# Patient Record
Sex: Female | Born: 1970 | Hispanic: No | Marital: Married | Smoking: Never smoker
Health system: Southern US, Community
[De-identification: ages and names within clinical notes are randomized; demographics above are authoritative.]

## PROBLEM LIST (undated history)

## (undated) DIAGNOSIS — E079 Disorder of thyroid, unspecified: Secondary | ICD-10-CM

## (undated) DIAGNOSIS — E119 Type 2 diabetes mellitus without complications: Secondary | ICD-10-CM

## (undated) HISTORY — DX: Type 2 diabetes mellitus without complications: E11.9

---

## 2005-04-12 ENCOUNTER — Other Ambulatory Visit: Admission: RE | Admit: 2005-04-12 | Discharge: 2005-04-12 | Payer: Self-pay | Admitting: Family Medicine

## 2006-10-08 ENCOUNTER — Other Ambulatory Visit: Admission: RE | Admit: 2006-10-08 | Discharge: 2006-10-08 | Payer: Self-pay | Admitting: Family Medicine

## 2007-10-23 ENCOUNTER — Other Ambulatory Visit: Admission: RE | Admit: 2007-10-23 | Discharge: 2007-10-23 | Payer: Self-pay | Admitting: Family Medicine

## 2016-01-06 ENCOUNTER — Encounter (HOSPITAL_COMMUNITY): Payer: Self-pay | Admitting: Emergency Medicine

## 2016-01-06 ENCOUNTER — Emergency Department (HOSPITAL_COMMUNITY): Payer: Self-pay

## 2016-01-06 ENCOUNTER — Emergency Department (HOSPITAL_COMMUNITY)
Admission: EM | Admit: 2016-01-06 | Discharge: 2016-01-06 | Disposition: A | Discharge status: Home / Self Care | Payer: Self-pay | Attending: Emergency Medicine | Admitting: Emergency Medicine

## 2016-01-06 DIAGNOSIS — N730 Acute parametritis and pelvic cellulitis: Secondary | ICD-10-CM

## 2016-01-06 DIAGNOSIS — R102 Pelvic and perineal pain: Secondary | ICD-10-CM | POA: Insufficient documentation

## 2016-01-06 DIAGNOSIS — N739 Female pelvic inflammatory disease, unspecified: Secondary | ICD-10-CM | POA: Insufficient documentation

## 2016-01-06 HISTORY — DX: Disorder of thyroid, unspecified: E07.9

## 2016-01-06 LAB — CBC WITH DIFFERENTIAL/PLATELET
Basophils Absolute: 0 10*3/uL (ref 0.0–0.1)
Basophils Relative: 0 %
Eosinophils Absolute: 0.2 10*3/uL (ref 0.0–0.7)
Eosinophils Relative: 2 %
HEMATOCRIT: 41.7 % (ref 36.0–46.0)
Hemoglobin: 14.1 g/dL (ref 12.0–15.0)
Lymphocytes Relative: 19 %
Lymphs Abs: 2.2 10*3/uL (ref 0.7–4.0)
MCH: 30.9 pg (ref 26.0–34.0)
MCHC: 33.8 g/dL (ref 30.0–36.0)
MCV: 91.2 fL (ref 78.0–100.0)
MONO ABS: 0.5 10*3/uL (ref 0.1–1.0)
MONOS PCT: 5 %
NEUTROS ABS: 8.8 10*3/uL — AB (ref 1.7–7.7)
Neutrophils Relative %: 74 %
Platelets: 374 10*3/uL (ref 150–400)
RBC: 4.57 MIL/uL (ref 3.87–5.11)
RDW: 11.6 % (ref 11.5–15.5)
WBC: 11.8 10*3/uL — ABNORMAL HIGH (ref 4.0–10.5)

## 2016-01-06 LAB — URINE MICROSCOPIC-ADD ON: RBC / HPF: NONE SEEN RBC/hpf (ref 0–5)

## 2016-01-06 LAB — COMPREHENSIVE METABOLIC PANEL
ALT: 16 U/L (ref 14–54)
ANION GAP: 10 (ref 5–15)
AST: 16 U/L (ref 15–41)
Albumin: 3.8 g/dL (ref 3.5–5.0)
Alkaline Phosphatase: 173 U/L — ABNORMAL HIGH (ref 38–126)
BILIRUBIN TOTAL: 0.6 mg/dL (ref 0.3–1.2)
BUN: 9 mg/dL (ref 6–20)
CO2: 26 mmol/L (ref 22–32)
Calcium: 9.2 mg/dL (ref 8.9–10.3)
Chloride: 96 mmol/L — ABNORMAL LOW (ref 101–111)
Creatinine, Ser: 0.68 mg/dL (ref 0.44–1.00)
GFR calc Af Amer: >60 mL/min (ref >60)
Glucose, Bld: 310 mg/dL — ABNORMAL HIGH (ref 65–99)
POTASSIUM: 4.1 mmol/L (ref 3.5–5.1)
Sodium: 132 mmol/L — ABNORMAL LOW (ref 135–145)
TOTAL PROTEIN: 7.8 g/dL (ref 6.5–8.1)

## 2016-01-06 LAB — LIPASE, BLOOD: Lipase: 31 U/L (ref 11–51)

## 2016-01-06 LAB — URINALYSIS, ROUTINE W REFLEX MICROSCOPIC
Bilirubin Urine: NEGATIVE
Glucose, UA: 500 mg/dL — AB
HGB URINE DIPSTICK: NEGATIVE
Ketones, ur: 15 mg/dL — AB
Nitrite: NEGATIVE
PROTEIN: NEGATIVE mg/dL
Specific Gravity, Urine: 1.015 (ref 1.005–1.030)
pH: 6 (ref 5.0–8.0)

## 2016-01-06 LAB — I-STAT BETA HCG BLOOD, ED (MC, WL, AP ONLY)

## 2016-01-06 MED ORDER — PROBENECID 500 MG PO TABS
1000.0000 mg | ORAL_TABLET | Freq: Once | ORAL | Status: AC
  Administered 2016-01-06: 1000 mg via ORAL
  Filled 2016-01-06 (×2): qty 2

## 2016-01-06 MED ORDER — DOXYCYCLINE HYCLATE 100 MG PO CAPS: ORAL_CAPSULE | ORAL | 0 refills | Status: DC

## 2016-01-06 MED ORDER — IOPAMIDOL (ISOVUE-300) INJECTION 61%
INTRAVENOUS | Status: AC
  Administered 2016-01-06: 100 mL
  Filled 2016-01-06: qty 100

## 2016-01-06 MED ORDER — DOXYCYCLINE HYCLATE 100 MG PO TABS
100.0000 mg | ORAL_TABLET | Freq: Once | ORAL | Status: AC
  Administered 2016-01-06: 100 mg via ORAL
  Filled 2016-01-06: qty 1

## 2016-01-06 MED ORDER — DEXTROSE 5 % IV SOLN
2.0000 g | Freq: Once | INTRAVENOUS | Status: AC
  Administered 2016-01-06: 2 g via INTRAVENOUS
  Filled 2016-01-06: qty 2

## 2016-01-06 MED ORDER — METFORMIN HCL 500 MG PO TABS: 500.0000 mg | ORAL_TABLET | Freq: Two times a day (BID) | ORAL | 0 refills | Status: DC

## 2016-01-06 NOTE — ED Triage Notes (Addendum)
Pt reports 3 weeks of lower abdominal pain. States only eating small amounts because she feels bloated all the time. Pt states last bowel movement this AM. Used laxative yesterday and stool softener the day before. Denies recent fever. Reports subjective fever a few weeks ago when this all started. Pt awake, alert, oriented x4, NAD at present. VSS.

## 2016-01-06 NOTE — Discharge Instructions (Signed)
Follow-up with the women's clinic next week to check on your infection. Follow-up with her family doctor next week to help with your elevated sugar

## 2016-01-06 NOTE — ED Provider Notes (Signed)
[facility]-EM[facility]Y DEPT Provider Note   CSN: [id number] Arrival date & time: 01/06/16  4098     History   Chief Complaint Chief Complaint  Patient presents with  . Abdominal Pain  . Bloated    HPI Olivia Reeves is a 45 y.o. female.  Patient complains of lower abdominal pain.   The history is provided by the patient. No language interpreter was used.  Abdominal Pain   This is a new problem. The current episode started more than 2 days ago. The problem occurs constantly. The problem has not changed since onset.Associated with: Nothing. The pain is located in the RLQ. The quality of the pain is aching. The pain is at a severity of 5/10. The pain is moderate. Pertinent negatives include anorexia, diarrhea, frequency, hematuria and headaches. Nothing aggravates the symptoms. Nothing relieves the symptoms. Past workup does not include GI consult. Her past medical history does not include PUD.    Past Medical History:  Diagnosis Date  . Thyroid disease     There are no active problems to display for this patient.   Past Surgical History:  Procedure Laterality Date  . CESAREAN SECTION      OB History    No data available       Home Medications    Prior to Admission medications   Medication Sig Start Date End Date Taking? Authorizing Provider  doxycycline (VIBRAMYCIN) 100 MG capsule Take one pill bid 01/06/16   Bethann Berkshire, MD  metFORMIN (GLUCOPHAGE) 500 MG tablet Take 1 tablet (500 mg total) by mouth 2 (two) times daily with a meal. 01/06/16   Bethann Berkshire, MD    Family History No family history on file.  Social History Social History  Substance Use Topics  . Smoking status: Never Smoker  . Smokeless tobacco: Not on file  . Alcohol use Yes     Comment: 1 glass of wine/week     Allergies   Aspirin   Review of Systems Review of Systems  Constitutional: Negative for appetite change and fatigue.  HENT: Negative for congestion, ear discharge and sinus pressure.    Eyes: Negative for discharge.  Respiratory: Negative for cough.   Cardiovascular: Negative for chest pain.  Gastrointestinal: Positive for abdominal pain. Negative for anorexia and diarrhea.  Genitourinary: Negative for frequency and hematuria.  Musculoskeletal: Negative for back pain.  Skin: Negative for rash.  Neurological: Negative for seizures and headaches.  Psychiatric/Behavioral: Negative for hallucinations.     Physical Exam Updated Vital Signs BP 116/81 (BP Location: Right Arm)   Pulse 80   Temp 98.3 F (36.8 C) (Oral)   Resp 16   Wt 135 lb (61.2 kg)   LMP  (LMP Unknown) Comment: pt with IUD  SpO2 97%   Physical Exam  Constitutional: She is oriented to person, place, and time. She appears well-developed.  HENT:  Head: Normocephalic.  Eyes: Conjunctivae and EOM are normal. No scleral icterus.  Neck: Neck supple. No thyromegaly present.  Cardiovascular: Normal rate and regular rhythm.  Exam reveals no gallop and no friction rub.   No murmur heard. Pulmonary/Chest: No stridor. She has no wheezes. She has no rales. She exhibits no tenderness.  Abdominal: She exhibits no distension. There is tenderness. There is no rebound.  Mild suprapubic and right lower quadrant abdominal pain and tenderness  Musculoskeletal: Normal range of motion. She exhibits no edema.  Lymphadenopathy:    She has no cervical adenopathy.  Neurological: She is oriented to person, place, and  time. She exhibits normal muscle tone. Coordination normal.  Skin: No rash noted. No erythema.  Psychiatric: She has a normal mood and affect. Her behavior is normal.     ED Treatments / Results  Labs (all labs ordered are listed, but only abnormal results are displayed) Labs Reviewed  CBC WITH DIFFERENTIAL/PLATELET - Abnormal; Notable for the following:       Result Value   WBC 11.8 (*)    Neutro Abs 8.8 (*)    All other components within normal limits  COMPREHENSIVE METABOLIC PANEL - Abnormal;  Notable for the following:    Sodium 132 (*)    Chloride 96 (*)    Glucose, Bld 310 (*)    Alkaline Phosphatase 173 (*)    All other components within normal limits  URINALYSIS, ROUTINE W REFLEX MICROSCOPIC (NOT AT [facility]) - Abnormal; Notable for the following:    Glucose, UA 500 (*)    Ketones, ur 15 (*)    Leukocytes, UA TRACE (*)    All other components within normal limits  URINE MICROSCOPIC-ADD ON - Abnormal; Notable for the following:    Squamous Epithelial / LPF 6-30 (*)    Bacteria, UA FEW (*)    All other components within normal limits  LIPASE, BLOOD  I-STAT BETA HCG BLOOD, ED ([facility], [facility], [facility] ONLY)  GC/CHLAMYDIA PROBE AMP ([place]) NOT AT [facility]    EKG  EKG Interpretation None       Radiology Ct Abdomen Pelvis W Contrast  Result Date: 01/06/2016 CLINICAL DATA:  Bilateral lower quadrant abdominal pain for 3 weeks. Difficulty going to the bathroom. EXAM: CT ABDOMEN AND PELVIS WITH CONTRAST TECHNIQUE: Multidetector CT imaging of the abdomen and pelvis was performed using the standard protocol following bolus administration of intravenous contrast. CONTRAST:  100mL ISOVUE-300 IOPAMIDOL (ISOVUE-300) INJECTION 61% COMPARISON:  None. FINDINGS: Lower chest:  Unremarkable. Hepatobiliary: 4 mm low-density lesion in the posterior segment of the right liver lobe consistent with a cyst. No other liver abnormality. Normal gallbladder. No bile duct dilation. Pancreas: Normal Spleen: Normal Adrenals/Urinary Tract: No adrenal masses. Normal kidneys, ureters and bladder. Stomach/Bowel: Normal.  Normal appendix visualized. Vascular/Lymphatic: Vasculature is unremarkable.  No adenopathy. Reproductive: Extending from the right uterine cornua, there is a fluid-filled tubular structure with a thick irregularly enhancing wall, with adjacent right adnexal fluid and stranding in the adnexal fat. The right ovary is not defined and is presumably part of this inflammatory appearing right adnexal process. No  left adnexal abnormality. Uterus is normal in size. There is a well-positioned IUD. Musculoskeletal: Unremarkable Other: Small amount of pelvic free fluid. IMPRESSION: 1. Inflammatory right adnexal process associated with a dilated thick-walled tubular structure consistent with either a hydrosalpinx or pyosalpinx. Findings support pelvic inflammatory disease in the proper clinical setting. 2. No other acute abnormality. Normal appendix is visualized. Normal appearance of the bowel. 3. Well-positioned IUD in the uterus. 4 mm liver cyst. No other abnormalities. Electronically Signed   By: Amie Portlandavid  Ormond M.D.   On: 01/06/2016 10:30    Procedures Procedures (including critical care time)  Medications Ordered in ED Medications  iopamidol (ISOVUE-300) 61 % injection (100 mLs  Contrast Given 01/06/16 1003)  cefOXitin (MEFOXIN) 2 g in dextrose 5 % 50 mL IVPB (0 g Intravenous Stopped 01/06/16 1205)  probenecid (BENEMID) tablet 1,000 mg (1,000 mg Oral Given 01/06/16 1244)  doxycycline (VIBRA-TABS) tablet 100 mg (100 mg Oral Given 01/06/16 1105)     Initial Impression / Assessment and Plan /  ED Course  I have reviewed the triage vital signs and the nursing notes.  Pertinent labs & imaging results that were available during my care of the patient were reviewed by me and considered in my medical decision making (see chart for details).  Clinical Course    Patient with PID. She is given some cefoxitin and doxycycline. Also patient's sugars elevated to 300 she will be put on Glucophage and will follow-up with the [facility] in the family doctor  Final Clinical Impressions(s) / ED Diagnoses   Final diagnoses:  PID (acute pelvic inflammatory disease)    New Prescriptions New Prescriptions   DOXYCYCLINE (VIBRAMYCIN) 100 MG CAPSULE    Take one pill bid   METFORMIN (GLUCOPHAGE) 500 MG TABLET    Take 1 tablet (500 mg total) by mouth 2 (two) times daily with a meal.     Bethann Berkshire, MD 01/06/16  1302

## 2016-01-07 LAB — GC/CHLAMYDIA PROBE AMP (~~LOC~~) NOT AT ARMC
CHLAMYDIA, DNA PROBE: NEGATIVE
NEISSERIA GONORRHEA: NEGATIVE

## 2016-01-11 ENCOUNTER — Ambulatory Visit (INDEPENDENT_AMBULATORY_CARE_PROVIDER_SITE_OTHER): Payer: Self-pay | Admitting: Obstetrics and Gynecology

## 2016-01-11 ENCOUNTER — Encounter: Payer: Self-pay | Admitting: Obstetrics and Gynecology

## 2016-01-11 VITALS — BP 109/66 | HR 69 | Wt 134.3 lb

## 2016-01-11 DIAGNOSIS — N7011 Chronic salpingitis: Secondary | ICD-10-CM

## 2016-01-11 DIAGNOSIS — N730 Acute parametritis and pelvic cellulitis: Secondary | ICD-10-CM

## 2016-01-11 DIAGNOSIS — Z30432 Encounter for removal of intrauterine contraceptive device: Secondary | ICD-10-CM

## 2016-01-11 MED ORDER — METRONIDAZOLE 500 MG PO TABS: 500.0000 mg | ORAL_TABLET | Freq: Two times a day (BID) | ORAL | 0 refills | Status: DC

## 2016-01-11 MED ORDER — MICONAZOLE NITRATE 2 % VA CREA: 1.0000 | TOPICAL_CREAM | Freq: Every day | VAGINAL | 0 refills | Status: DC

## 2016-01-11 NOTE — Procedures (Signed)
IUD Removal Procedure Note  Pre-operative Diagnosis: Mirena IUD in place and expired. Resolving abdominal pain  Post-operative Diagnosis: same  Procedure Details   The risks (including infection, bleeding, pain) and benefits of the procedure were explained to the patient and Verbal informed consent was obtained.  The patient was placed in the dorsal lithotomy position.  A Graves' speculum inserted in the vagina, and the cervix was visualized and a ringed forceps used to grasp the strings and it was easily removed and noted to be intact. No bleeding during procedure.   Condition: Stable  Complications: None  Plan: The patient was advised to call for any fever or for prolonged or severe pain or bleeding. She was advised to use condoms for birth control.  Cornelia Copaharlie Esme Durkin, Jr MD Attending [facility](Faculty Practice)

## 2016-01-11 NOTE — Progress Notes (Signed)
error 

## 2016-01-11 NOTE — Progress Notes (Signed)
Obstetrics and Gynecology Visit New Patient Evaluation  Appointment Date: 01/11/2016  OB[facility]N Clinic[facility]  Primary Care Provider: None  [facility]  Chief Complaint:  ER follow up History of Present Illness: Olivia CarbonMerlita Coughlin is a 45 y.o. Asian G3P3 (LMP: none due to Mirena), seen for the above chief complaint. Her past medical history is significant for h/o c-section x 3.    She was seen in the ED on 9/7 after 3 weeks of increasing lower abdominal pain and bloating.  In the ER she described the pain as in the RLQ, constant, aching and 5/10 severity.  She never had these symptoms before.  A CT abdomen with contrast revealed inflammatory right adnexal process suggestive of PID and no other acute abnormality, with a right adnexal inflammatory process with hydro vs pyosalpinx with a normal appendix .  She was given a dose of cefoxitin (no probenecid) and discharged with doxycycline; her GC/CT, u/a, beta hcg, cmp, lipase.  At today's visit she states she's doing well and feels much better; denies abdominal pain, fever, chills, nausea, vomiting, diarrhea, decrease in appetite, vaginal bleeding, discharge, fevers, chills, or urinary symptoms.  She has been compliant in taking her doxycycline.  The Mirena [facility] 5 years ago [facility] [place].  She is undecided on what contraceptive method she will use after the IUD is removed.  Review of Systems:Her 12 point review of systems is negative or as noted in the History of Present Illness.   Past Medical History:  Past Medical History:  Diagnosis Date  . Thyroid disease     Past Surgical History:  Past Surgical History:  Procedure Laterality Date  . CESAREAN SECTION      Past Obstetrical History:  OB History  Gravida Para Term Preterm AB Living  3 3 3         SAB TAB Ectopic Multiple Live Births          3    # Outcome Date GA Lbr Len/2nd Weight Sex Delivery Anes PTL Lv  3  Term           2 Term           1 Term              Cesarean section x 3  Past Gynecological History: As per HPI.  Social History:  Social History   Social History  . Marital status: Unknown    Spouse name: N/A  . Number of children: N/A  . Years of education: N/A   Occupational History  . Not on file.   Social History Main Topics  . Smoking status: Never Smoker  . Smokeless tobacco: Not on file  . Alcohol use Yes     Comment: 1 glass of wine/week  . Drug use: No  . Sexual activity: Yes    Birth control/ protection: IUD   Other Topics Concern  . Not on file   Social History Narrative  . No narrative on file    Family History: No family history on file. She denies any female cancers, bleeding or blood clotting disorders.   Medications Doxycycline 100mg  bid  Allergies Aspirin   Physical Exam:  BP 109/66   Pulse 69   Wt 134 lb 4.8 oz (60.9 kg)   LMP  (LMP Unknown) Comment: pt with IUD There is no height or weight on file to calculate BMI. General appearance: Well nourished, well developed female in no  acute distress.  Neck:  Supple, normal appearance, and no thyromegaly  Cardiovascular: normal s1 and s2.  No murmurs, rubs or gallops. Respiratory:  Clear to auscultation bilateral. Normal respiratory effort Abdomen: positive bowel sounds and no masses, hernias; diffusely non tender to palpation, non distended Neuro/Psych:  Normal mood and affect.  Skin:  Warm and dry.  Lymphatic:  No inguinal lymphadenopathy.   Pelvic exam: is not limited by body habitus EGBUS: within normal limits Vagina: within normal limits and with no blood in the vault, Cervix:  no lesions or cervical motion tenderness and with two grey IUD strings seen (see procedure note for easy mirena removal) Uterus:  nonenlarged and approximately 8 week sized Adnexa:  normal adnexa and no mass, fullness, tenderness Rectovaginal: deferred  Laboratory: as above  Radiology: as  above  Assessment: pt doing well  Plan:  Will add flagyl bid x 14 d given IUD is in place. Will see patient back in 6-8wks for f/u u/s to assess likely right sided hydrosalpinx. Pt doesn't have insurance and lives in [place] Resources given and pt encouraged to use [facility] and go by [facility] to establish PCP care. Pt unsure about contraception and was advised condoms   Orders Placed This Encounter  Procedures  . US Pelvis Complete  . US Transvaginal Non-OB   Review of Systems  Constitutional: Negative for chills, fever and weight loss.  Respiratory: Negative for cough and shortness of breath.   Cardiovascular: Negative for chest pain and palpitations.  Gastrointestinal: Negative for abdominal pain, diarrhea, nausea and vomiting.  Skin: Negative for rash.  Neurological: Negative for headaches.

## 2016-01-12 ENCOUNTER — Encounter: Payer: Self-pay | Admitting: Obstetrics and Gynecology

## 2016-01-12 DIAGNOSIS — N7011 Chronic salpingitis: Secondary | ICD-10-CM | POA: Insufficient documentation

## 2016-02-22 ENCOUNTER — Ambulatory Visit (HOSPITAL_COMMUNITY): Payer: Self-pay | Attending: Obstetrics and Gynecology

## 2016-02-24 ENCOUNTER — Encounter: Payer: Self-pay | Admitting: Obstetrics and Gynecology

## 2016-02-24 ENCOUNTER — Ambulatory Visit: Payer: Self-pay | Admitting: Obstetrics and Gynecology

## 2016-02-24 NOTE — Progress Notes (Signed)
Patient did not keep GYN follow up appointment for 02/24/2016.  Olivia Reeves, Jr MD Attending [facility](Faculty Practice)

## 2016-07-24 ENCOUNTER — Encounter: Payer: Self-pay | Admitting: Family Medicine

## 2016-07-24 ENCOUNTER — Ambulatory Visit (INDEPENDENT_AMBULATORY_CARE_PROVIDER_SITE_OTHER): Payer: Self-pay | Admitting: Family Medicine

## 2016-07-24 VITALS — BP 114/70 | HR 71 | Temp 98.7°F | Ht 59.0 in | Wt 137.0 lb

## 2016-07-24 DIAGNOSIS — Z114 Encounter for screening for human immunodeficiency virus [HIV]: Secondary | ICD-10-CM

## 2016-07-24 DIAGNOSIS — E119 Type 2 diabetes mellitus without complications: Secondary | ICD-10-CM

## 2016-07-24 DIAGNOSIS — R5383 Other fatigue: Secondary | ICD-10-CM

## 2016-07-24 LAB — CBC WITH DIFFERENTIAL/PLATELET
BASOS PCT: 1 %
Basophils Absolute: 60 cells/uL (ref 0–200)
EOS PCT: 4 %
Eosinophils Absolute: 240 cells/uL (ref 15–500)
HCT: 44.6 % (ref 35.0–45.0)
Hemoglobin: 15.1 g/dL (ref 11.7–15.5)
LYMPHS PCT: 43 %
Lymphs Abs: 2580 cells/uL (ref 850–3900)
MCH: 31.5 pg (ref 27.0–33.0)
MCHC: 33.9 g/dL (ref 32.0–36.0)
MCV: 92.9 fL (ref 80.0–100.0)
MONOS PCT: 4 %
MPV: 10.1 fL (ref 7.5–12.5)
Monocytes Absolute: 240 cells/uL (ref 200–950)
NEUTROS ABS: 2880 {cells}/uL (ref 1500–7800)
Neutrophils Relative %: 48 %
PLATELETS: 273 10*3/uL (ref 140–400)
RBC: 4.8 MIL/uL (ref 3.80–5.10)
RDW: 14.2 % (ref 11.0–15.0)
WBC: 6 10*3/uL (ref 3.8–10.8)

## 2016-07-24 LAB — COMPREHENSIVE METABOLIC PANEL
ALK PHOS: 47 U/L (ref 33–115)
ALT: 23 U/L (ref 6–29)
AST: 18 U/L (ref 10–35)
Albumin: 4.4 g/dL (ref 3.6–5.1)
BILIRUBIN TOTAL: 0.8 mg/dL (ref 0.2–1.2)
BUN: 10 mg/dL (ref 7–25)
CO2: 25 mmol/L (ref 20–31)
Calcium: 9.1 mg/dL (ref 8.6–10.2)
Chloride: 103 mmol/L (ref 98–110)
Creat: 0.61 mg/dL (ref 0.50–1.10)
GLUCOSE: 182 mg/dL — AB (ref 65–99)
Potassium: 4.1 mmol/L (ref 3.5–5.3)
Sodium: 137 mmol/L (ref 135–146)
Total Protein: 7.5 g/dL (ref 6.1–8.1)

## 2016-07-24 LAB — GLUCOSE, CAPILLARY: Glucose-Capillary: 177 mg/dL — ABNORMAL HIGH (ref 65–99)

## 2016-07-24 LAB — POCT GLYCOSYLATED HEMOGLOBIN (HGB A1C): Hemoglobin A1C: 9.4

## 2016-07-24 MED ORDER — METFORMIN HCL 1000 MG PO TABS: 1000.0000 mg | ORAL_TABLET | Freq: Two times a day (BID) | ORAL | 3 refills | Status: AC

## 2016-07-24 MED ORDER — GLIPIZIDE 5 MG PO TABS: 5.0000 mg | ORAL_TABLET | Freq: Two times a day (BID) | ORAL | 3 refills | Status: DC

## 2016-07-24 MED FILL — glipiZIDE 5 MG TABS: 5 | 30 days supply | Qty: 60 | Fill #0

## 2016-07-24 MED FILL — ?METFORMIN HCL 1,000 MG TAB: 1000 | 30 days supply | Qty: 60 | Fill #0

## 2016-07-24 NOTE — Progress Notes (Signed)
   Patient ID: Olivia Reeves, female    DOB: 07/13/1970, 46 y.o.   MRN: [id number]  PCP: Joaquin CourtsKimberly Joffre Lucks, FNP  Chief Complaint  Patient presents with  . Establish Care    Subjective:  HPI Olivia CarbonMerlita Daughdrill is a 46 y.o. female presenting today to establish care,  diabetes follow-up, and an acute complaint of fatigue. This patients first encounter with me today.  Today she reports that she was diagnosed with diabetes in 2017 after she was diagnosed with gestational diabetes over 5 years ago. She doesn't monitor glucose at home is uncertain of her glycemic control. She works for [employer] and other than routine physical activity associated with work she reports no routine physical activity. Kerina, reports over the last several days she has experienced increased thirst, polyuria, and recent numbness and tingling of feet and hands. Denies chest pain, shortness of breath, or dizziness. Eye exam last year although she reports that she has never had an diabetes opthalmic exam.  Fatigue Over the last several months Nafeesah reports experiencing extreme fatigue. She reports even with 8-9 hour nights sleep she continues to feel drained in spite of adequate rest. Denies history of thyroid dysfunction or sleep apnea.   Review of Systems See HPI    Objective:   Today's Vitals   07/24/16 1048  BP: 114/70  Pulse: 71  Temp: 98.7 F (37.1 C)  TempSrc: Oral  SpO2: 99%  Weight: 137 lb (62.1 kg)  Height: 4\' 11"  (1.499 m)    Wt Readings from Last 3 Encounters:  07/24/16 137 lb (62.1 kg)  01/11/16 134 lb 4.8 oz (60.9 kg)  01/06/16 135 lb (61.2 kg)    Physical Exam  Constitutional: She is oriented to person, place, and time. She appears well-developed and well-nourished.  HENT:  Head: Normocephalic.  Nose: Nose normal.  Mouth/Throat: Oropharynx is clear and moist.  Eyes: Conjunctivae are normal. Pupils are equal, round, and reactive to light.  Neck: Normal range of motion. Neck supple. No  thyromegaly present.  Cardiovascular: Normal rate, regular rhythm, normal heart sounds and intact distal pulses.   Pulmonary/Chest: Effort normal and breath sounds normal.  Musculoskeletal: Normal range of motion.  Lymphadenopathy:    She has no cervical adenopathy.  Neurological: She is alert and oriented to person, place, and time.  Skin: Skin is warm and dry.  Psychiatric: She has a normal mood and affect. Her behavior is normal. Judgment and thought content normal.     Assessment & Plan:  1. Type 2 diabetes mellitus without complication, without long-term current use of insulin (HCC)-A1C elevated 9.4 -Resume and increase Metformin 1000 mg BID with meals. -Adding Glipizide 5 mg 2 times daily before meals -If no A1C improvement within 3 months, will add Onglaza or Januvia with Metformin to improve glycemic control. -Referring to diabetes education   2. Screening for HIV (human immunodeficiency virus) - HIV antibody  3. Fatigue, consider hypothyroidism and or fatigue related to uncontrolled diabetes -Screen TSH panel  -Discussed diabetes opthalmology exam with patient. She will obtain in May.  -Follow-up in 3 months for A1C recheck, PAP, and discuss overdue immunization. -We will notify you of your lab results.  Godfrey PickKimberly S. Tiburcio PeaHarris, MSN, [facility] [address] [place], [place] [address] [phone]

## 2016-07-24 NOTE — Patient Instructions (Addendum)
Continue to increase physical activity and reduce of foods high in simple sugars and starches.  Your A1C today is 9.4.   Below are the medication changes for your diabetes management.   Take Metformin 1,000 mg twice daily with food and Glipizide 5 mg twice daily with meals.  Glipizide can cause hypoglycemia therefore ensure that your are taking this medication with food.   Schedule a follow-up to have your PAP Smear completed and I will see you back in 3 months for a routine diabetes follow-up.     Type 2 Diabetes Mellitus, Diagnosis, Adult Type 2 diabetes (type 2 diabetes mellitus) is a long-term (chronic) disease. It may be caused by one or both of these problems:  Your body does not make enough of a hormone called insulin.  Your body does not react in a normal way to insulin that it makes. Insulin lets sugars (glucose) go into cells in the body. This gives you energy. If you have type 2 diabetes, sugars cannot get into cells. This causes high blood sugar (hyperglycemia). Your doctor will set treatment goals for you. Generally, you should have these blood sugar levels:  Before meals (preprandial): 80-130 mg/dL (1.6-1.04.4-7.2 mmol/L).  After meals (postprandial): below 180 mg/dL (10 mmol/L).  A1c (hemoglobin A1c) level: less than 7%. Follow these instructions at home: Questions to Ask Your Doctor   You may want to ask these questions:  Do I need to meet with a diabetes educator?  Where can I find a support group for people with diabetes?  What equipment will I need to care for myself at home?  What diabetes medicines do I need? When should I take them?  How often do I need to check my blood sugar?  What number can I call if I have questions?  When is my next doctor's visit? General instructions   Take over-the-counter and prescription medicines only as told by your doctor.  Keep all follow-up visits as told by your doctor. This is important. Contact a doctor  if:  Your blood sugar is at or above 240 mg/dL (96.013.3 mmol/L) for 2 days in a row.  You have been sick or have had a fever for 2 days or more and you are not getting better.  You have any of these problems for more than 6 hours:  You cannot eat or drink.  You feel sick to your stomach (nauseous).  You throw up (vomit).  You have watery poop (diarrhea). Get help right away if:  Your blood sugar is lower than 54 mg/dL (3 mmol/L).  You get confused.  You have trouble:  Thinking clearly.  Breathing.  You have moderate or large ketone levels in your pee (urine). This information is not intended to replace advice given to you by your health care provider. Make sure you discuss any questions you have with your health care provider. Document Released: 01/25/2008 Document Revised: 09/23/2015 Document Reviewed: 05/21/2015 Elsevier Interactive Patient Education  2017 [employer]

## 2016-07-25 LAB — THYROID PANEL WITH TSH
FREE THYROXINE INDEX: 1.8 (ref 1.4–3.8)
T3 UPTAKE: 30 % (ref 22–35)
T4, Total: 6.1 ug/dL (ref 4.5–12.0)
TSH: 10.09 mIU/L — ABNORMAL HIGH

## 2016-07-25 LAB — MICROALBUMIN, URINE: Microalb, Ur: 6.3 mg/dL

## 2016-07-25 LAB — HIV ANTIBODY (ROUTINE TESTING W REFLEX): HIV: NONREACTIVE

## 2016-07-26 ENCOUNTER — Telehealth: Payer: Self-pay | Admitting: Family Medicine

## 2016-07-26 DIAGNOSIS — E039 Hypothyroidism, unspecified: Secondary | ICD-10-CM

## 2016-07-26 MED ORDER — LEVOTHYROXINE SODIUM 75 MCG PO TABS: 75.0000 ug | ORAL_TABLET | Freq: Every day | ORAL | 3 refills | Status: DC

## 2016-07-26 NOTE — Telephone Encounter (Signed)
Called to advise patient of abnormal thyroid labs indicating hypothyroidism. No answer or voicemail when called phone number on file.  I have placed patient on 75 mcg of Levothyroxine and she is already schedule to see me three months from now so I will repeat her TSH at that time.  Godfrey PickKimberly S. Tiburcio PeaHarris, MSN, [facility]NP-C [facility] [address] [place], [place] [address] [phone]

## 2016-07-27 DIAGNOSIS — E039 Hypothyroidism, unspecified: Secondary | ICD-10-CM | POA: Insufficient documentation

## 2016-07-27 DIAGNOSIS — E119 Type 2 diabetes mellitus without complications: Secondary | ICD-10-CM | POA: Insufficient documentation

## 2016-07-27 NOTE — Telephone Encounter (Signed)
Tried to call patient again no answer 

## 2016-07-31 NOTE — Telephone Encounter (Signed)
Tried to call patient again and no answer. Letter was sent out with the information.

## 2016-08-28 MED FILL — glipiZIDE 5 MG TABS: 5 | 30 days supply | Qty: 60 | Fill #1

## 2016-08-28 MED FILL — ?METFORMIN HCL 1,000 MG TAB: 1000 | 30 days supply | Qty: 60 | Fill #1

## 2016-09-05 ENCOUNTER — Telehealth: Payer: Self-pay

## 2016-09-05 NOTE — Telephone Encounter (Signed)
Patient would also like to have a glucose meter and strips sent into pharmacy.

## 2016-09-06 ENCOUNTER — Ambulatory Visit (INDEPENDENT_AMBULATORY_CARE_PROVIDER_SITE_OTHER): Payer: BLUE CROSS/BLUE SHIELD | Admitting: Family Medicine

## 2016-09-06 ENCOUNTER — Encounter: Payer: Self-pay | Admitting: Family Medicine

## 2016-09-06 VITALS — BP 122/70 | HR 69 | Temp 98.7°F | Resp 16 | Ht 59.0 in | Wt 136.0 lb

## 2016-09-06 DIAGNOSIS — R42 Dizziness and giddiness: Secondary | ICD-10-CM | POA: Diagnosis not present

## 2016-09-06 DIAGNOSIS — E890 Postprocedural hypothyroidism: Secondary | ICD-10-CM

## 2016-09-06 DIAGNOSIS — E119 Type 2 diabetes mellitus without complications: Secondary | ICD-10-CM

## 2016-09-06 LAB — CBC WITH DIFFERENTIAL/PLATELET
BASOS PCT: 1 %
Basophils Absolute: 67 cells/uL (ref 0–200)
EOS PCT: 3 %
Eosinophils Absolute: 201 cells/uL (ref 15–500)
HEMATOCRIT: 41.7 % (ref 35.0–45.0)
HEMOGLOBIN: 14.5 g/dL (ref 11.7–15.5)
LYMPHS ABS: 2546 {cells}/uL (ref 850–3900)
Lymphocytes Relative: 38 %
MCH: 31.9 pg (ref 27.0–33.0)
MCHC: 34.8 g/dL (ref 32.0–36.0)
MCV: 91.6 fL (ref 80.0–100.0)
MPV: 10 fL (ref 7.5–12.5)
Monocytes Absolute: 201 cells/uL (ref 200–950)
Monocytes Relative: 3 %
NEUTROS ABS: 3685 {cells}/uL (ref 1500–7800)
Neutrophils Relative %: 55 %
Platelets: 238 10*3/uL (ref 140–400)
RBC: 4.55 MIL/uL (ref 3.80–5.10)
RDW: 14.4 % (ref 11.0–15.0)
WBC: 6.7 10*3/uL (ref 3.8–10.8)

## 2016-09-06 LAB — POCT URINALYSIS DIP (DEVICE)
BILIRUBIN URINE: NEGATIVE
GLUCOSE, UA: 250 mg/dL — AB
KETONES UR: NEGATIVE mg/dL
LEUKOCYTES UA: NEGATIVE
Nitrite: NEGATIVE
Protein, ur: NEGATIVE mg/dL
Urobilinogen, UA: 0.2 mg/dL (ref 0.0–1.0)
pH: 5.5 (ref 5.0–8.0)

## 2016-09-06 LAB — COMPLETE METABOLIC PANEL WITH GFR
ALBUMIN: 4.4 g/dL (ref 3.6–5.1)
ALT: 36 U/L — AB (ref 6–29)
AST: 28 U/L (ref 10–35)
Alkaline Phosphatase: 43 U/L (ref 33–115)
BILIRUBIN TOTAL: 0.5 mg/dL (ref 0.2–1.2)
BUN: 13 mg/dL (ref 7–25)
CALCIUM: 9.1 mg/dL (ref 8.6–10.2)
CO2: 23 mmol/L (ref 20–31)
CREATININE: 0.73 mg/dL (ref 0.50–1.10)
Chloride: 105 mmol/L (ref 98–110)
GFR, Est Non African American: >89 mL/min (ref >=60)
Glucose, Bld: 122 mg/dL — ABNORMAL HIGH (ref 65–99)
Potassium: 4 mmol/L (ref 3.5–5.3)
Sodium: 139 mmol/L (ref 135–146)
TOTAL PROTEIN: 7.3 g/dL (ref 6.1–8.1)

## 2016-09-06 LAB — POCT URINE PREGNANCY: PREG TEST UR: NEGATIVE

## 2016-09-06 LAB — GLUCOSE, CAPILLARY: GLUCOSE-CAPILLARY: 150 mg/dL — AB (ref 65–99)

## 2016-09-06 MED ORDER — MECLIZINE HCL 25 MG PO TABS: 25.0000 mg | ORAL_TABLET | Freq: Three times a day (TID) | ORAL | 0 refills | Status: AC | PRN

## 2016-09-06 MED ORDER — LEVOTHYROXINE SODIUM 75 MCG PO TABS: 75.0000 ug | ORAL_TABLET | Freq: Every day | ORAL | 0 refills | Status: AC

## 2016-09-06 MED ORDER — MECLIZINE HCL 25 MG PO TABS: 25.0000 mg | ORAL_TABLET | Freq: Three times a day (TID) | ORAL | 0 refills | Status: DC | PRN

## 2016-09-06 NOTE — Progress Notes (Signed)
Patient ID: Olivia Reeves, female    DOB: [birth year], 46 y.o.   MRN: [id number]  PCP: Bing Neighbors, FNP  Chief Complaint  Patient presents with  . Dizziness    Subjective:  HPI  Olivia Reeves is a 46 y.o. female presents for evaluation of acute onset of dizziness.  Medical problems include Hypothyroidism and Type 2 Diabetes  Reports awakening 3 days ago with dizziness and sensation that the room was spinning.  Dizziness has persisted for the last 3 days and has been accompanied by nausea without vomiting.  She also complains of mid epigastric pain, frequent belching, and 3 days of diarrhea.  Reports good hydration and drinks an average for 4- 12 oz bottle of water daily. She also has had decreased appetite. She has missed a total of 3 days of work due to symptoms.  Feels fatigue, had two episodic headaches which resolved without over the counter medication. Today she reports that she was diagnosed with hyperthyroidism several years ago and received radiation treatment to her thyroid which caused her to develop hypothyroidism.  In the past, she had been treated with Synthroid and stopped taking 4 years ago as she disliked the way the medication tasted. Denies the presence of focal symptoms such as facial drooping/weakness, numbness, or blurry vision. To her knowledge, she has never had any episodes of vertigo or acute dizziness.    Social History   Social History  . Marital status: Married    Spouse name: N/A  . Number of children: N/A  . Years of education: N/A   Occupational History  . Not on file.   Social History Main Topics  . Smoking status: Never Smoker  . Smokeless tobacco: Never Used  . Alcohol use Yes     Comment: 1 glass of wine/week  . Drug use: No  . Sexual activity: Yes    Birth control/ protection: IUD   Other Topics Concern  . Not on file   Social History Narrative  . No narrative on file   Review of Systems See HPI Patient Active  Problem List   Diagnosis Date Noted  . T2DM (type 2 diabetes mellitus) (HCC) 07/27/2016  . Hypothyroidism 07/27/2016  . Right Hydrosalpinx 01/12/2016    Allergies  Allergen Reactions  . Aspirin Hives    Prior to Admission medications   Medication Sig Start Date End Date Taking? Authorizing Provider  glipiZIDE (GLUCOTROL) 5 MG tablet Take 1 tablet (5 mg total) by mouth 2 (two) times daily before a meal. 07/24/16  Yes Bing Neighbors, FNP  metFORMIN (GLUCOPHAGE) 1000 MG tablet Take 1 tablet (1,000 mg total) by mouth 2 (two) times daily with a meal. 07/24/16  Yes Bing Neighbors, FNP  levothyroxine (SYNTHROID, LEVOTHROID) 75 MCG tablet Take 1 tablet (75 mcg total) by mouth daily. Patient not taking: Reported on 09/06/2016 07/26/16   Bing Neighbors, FNP    Past Medical, Surgical Family and Social History reviewed and updated.    Objective:   Today's Vitals   09/06/16 0813  BP: 122/70  Pulse: 69  Resp: 16  Temp: 98.7 F (37.1 C)  TempSrc: Oral  SpO2: 100%  Weight: 136 lb (61.7 kg)  Height: 4\' 11"  (1.499 m)    Wt Readings from Last 3 Encounters:  09/06/16 136 lb (61.7 kg)  07/24/16 137 lb (62.1 kg)  01/11/16 134 lb 4.8 oz (60.9 kg)    Physical Exam  Constitutional: She is oriented to person, place, and time. She  appears well-developed and well-nourished.  HENT:  Head: Normocephalic and atraumatic.  Neck: Normal range of motion. Neck supple. No thyromegaly present.  Cardiovascular: Normal rate, regular rhythm, normal heart sounds and intact distal pulses.   Pulmonary/Chest: Effort normal and breath sounds normal.  Abdominal: Soft. Bowel sounds are normal.  Musculoskeletal: Normal range of motion.  Neurological: She is alert and oriented to person, place, and time. She has normal strength. No cranial nerve deficit or sensory deficit. She displays a negative Romberg sign. GCS eye subscore is 4. GCS verbal subscore is 5. GCS motor subscore is 6.  Negative Dix Hallpike  maneuver   Psychiatric: She has a normal mood and affect. Her behavior is normal. Judgment and thought content normal.    Assessment & Plan:  1. Dizziness, non reproducible dizziness. Likely vertigo as it is described as sudden onset of room spinning. Suffers from hypothyroidism and has not resumed prescribed synthroid. This could be contributing to her symptoms. Glucose is stable today. No concern for hypo/hyper glycemia.  Will treat symptomatically for now with meclizine  and start patient back on Levothyroxine. If no improvement of dizziness, will consider vestibular rehabilitation.   2. Hypothyroidism - Thyroid Panel With TSH -Start Levothyroxine 75 mcg daily 30 minutes prior to breakfast.  RTC: 6 weeks to recheck TSH    Godfrey PickKimberly S. Tiburcio PeaHarris, MSN, [facility]NP-C [facility] [address] [place], [place] [address] [phone]

## 2016-09-06 NOTE — Patient Instructions (Signed)
Take first dose of meclizine 25 mg at bedtime or while at home as it may cause dizziness. May take up to 3 times daily as needed for dizziness.   Begin taking your levothyroxine 75 mcg to improve thyroid function.    Hypothyroidism Hypothyroidism is a disorder of the thyroid. The thyroid is a large gland that is located in the lower front of the neck. The thyroid releases hormones that control how the body works. With hypothyroidism, the thyroid does not make enough of these hormones. What are the causes? Causes of hypothyroidism may include:  Viral infections.  Pregnancy.  Your own defense system (immune system) attacking your thyroid.  Certain medicines.  Birth defects.  Past radiation treatments to your head or neck.  Past treatment with radioactive iodine.  Past surgical removal of part or all of your thyroid.  Problems with the gland that is located in the center of your brain (pituitary). What are the signs or symptoms? Signs and symptoms of hypothyroidism may include:  Feeling as though you have no energy (lethargy).  Inability to tolerate cold.  Weight gain that is not explained by a change in diet or exercise habits.  Dry skin.  Coarse hair.  Menstrual irregularity.  Slowing of thought processes.  Constipation.  Sadness or depression. How is this diagnosed? Your health care provider may diagnose hypothyroidism with blood tests and ultrasound tests. How is this treated? Hypothyroidism is treated with medicine that replaces the hormones that your body does not make. After you begin treatment, it may take several weeks for symptoms to go away. Follow these instructions at home:  Take medicines only as directed by your health care provider.  If you start taking any new medicines, tell your health care provider.  Keep all follow-up visits as directed by your health care provider. This is important. As your condition improves, your dosage needs may  change. You will need to have blood tests regularly so that your health care provider can watch your condition. Contact a health care provider if:  Your symptoms do not get better with treatment.  You are taking thyroid replacement medicine and:  You sweat excessively.  You have tremors.  You feel anxious.  You lose weight rapidly.  You cannot tolerate heat.  You have emotional swings.  You have diarrhea.  You feel weak. Get help right away if:  You develop chest pain.  You develop an irregular heartbeat.  You develop a rapid heartbeat. This information is not intended to replace advice given to you by your health care provider. Make sure you discuss any questions you have with your health care provider. Document Released: 04/17/2005 Document Revised: 09/23/2015 Document Reviewed: 09/02/2013 Elsevier Interactive Patient Education  2017 Elsevier Inc.  Dizziness Dizziness is a common problem. It is a feeling of unsteadiness or light-headedness. You may feel like you are about to faint. Dizziness can lead to injury if you stumble or fall. Anyone can become dizzy, but dizziness is more common in older adults. This condition can be caused by a number of things, including medicines, dehydration, or illness. Follow these instructions at home: Taking these steps may help with your condition: Eating and drinking   Drink enough fluid to keep your urine clear or pale yellow. This helps to keep you from becoming dehydrated. Try to drink more clear fluids, such as water.  Do not drink alcohol.  Limit your caffeine intake if directed by your health care provider.  Limit your salt intake if  directed by your health care provider. Activity   Avoid making quick movements.  Rise slowly from chairs and steady yourself until you feel okay.  In the morning, first sit up on the side of the bed. When you feel okay, stand slowly while you hold onto something until you know that your  balance is fine.  Move your legs often if you need to stand in one place for a long time. Tighten and relax your muscles in your legs while you are standing.  Do not drive or operate heavy machinery if you feel dizzy.  Avoid bending down if you feel dizzy. Place items in your home so that they are easy for you to reach without leaning over. Lifestyle   Do not use any tobacco products, including cigarettes, chewing tobacco, or electronic cigarettes. If you need help quitting, ask your health care provider.  Try to reduce your stress level, such as with yoga or meditation. Talk with your health care provider if you need help. General instructions   Watch your dizziness for any changes.  Take medicines only as directed by your health care provider. Talk with your health care provider if you think that your dizziness is caused by a medicine that you are taking.  Tell a friend or a family member that you are feeling dizzy. If he or she notices any changes in your behavior, have this person call your health care provider.  Keep all follow-up visits as directed by your health care provider. This is important. Contact a health care provider if:  Your dizziness does not go away.  Your dizziness or light-headedness gets worse.  You feel nauseous.  You have reduced hearing.  You have new symptoms.  You are unsteady on your feet or you feel like the room is spinning. Get help right away if:  You vomit or have diarrhea and are unable to eat or drink anything.  You have problems talking, walking, swallowing, or using your arms, hands, or legs.  You feel generally weak.  You are not thinking clearly or you have trouble forming sentences. It may take a friend or family member to notice this.  You have chest pain, abdominal pain, shortness of breath, or sweating.  Your vision changes.  You notice any bleeding.  You have a headache.  You have neck pain or a stiff neck.  You have a  fever. This information is not intended to replace advice given to you by your health care provider. Make sure you discuss any questions you have with your health care provider. Document Released: 10/11/2000 Document Revised: 09/23/2015 Document Reviewed: 04/13/2014 Elsevier Interactive Patient Education  2017 [employer]

## 2016-09-07 LAB — THYROID PANEL WITH TSH
Free Thyroxine Index: 2 (ref 1.4–3.8)
T3 Uptake: 29 % (ref 22–35)
T4, Total: 6.8 ug/dL (ref 4.5–12.0)
TSH: 12.31 m[IU]/L — AB

## 2016-09-11 ENCOUNTER — Telehealth: Payer: Self-pay | Admitting: Family Medicine

## 2016-09-11 DIAGNOSIS — R42 Dizziness and giddiness: Secondary | ICD-10-CM

## 2016-09-11 NOTE — Telephone Encounter (Signed)
Patient scheduled for 09/12/2016 at 3:30pm.

## 2016-09-11 NOTE — Telephone Encounter (Signed)
I have ordered a stat CT of the head without contrast. Please schedule no later than tomorrow. Reason for worsening dizziness.

## 2016-09-12 ENCOUNTER — Ambulatory Visit (HOSPITAL_COMMUNITY)
Admission: RE | Admit: 2016-09-12 | Discharge: 2016-09-12 | Disposition: A | Discharge status: Home / Self Care | Payer: BLUE CROSS/BLUE SHIELD | Source: Ambulatory Visit | Attending: Family Medicine | Admitting: Family Medicine

## 2016-09-12 ENCOUNTER — Other Ambulatory Visit: Payer: Self-pay | Admitting: Family Medicine

## 2016-09-12 DIAGNOSIS — R42 Dizziness and giddiness: Secondary | ICD-10-CM | POA: Diagnosis not present

## 2016-09-12 NOTE — Progress Notes (Signed)
Spoke with [name] to advise of negative CT scan. She reports only one episode of dizziness today.  Feels condition is improving. Advised patient to call me on Friday if complete resolution of dizziness hasn't occurred and I will consider a referral to neurology. Advised to continue taking medication as prescribed.   Godfrey PickKimberly S. Tiburcio PeaHarris, MSN, FNP-C [facility]  [address] [place], [place] [address] [phone]

## 2016-09-20 ENCOUNTER — Telehealth: Payer: Self-pay | Admitting: Family Medicine

## 2016-09-20 NOTE — Telephone Encounter (Signed)
FMLA paperwork completed. Patient needs to return to office to complete HIPPA release in order for forms and progress notes to be released to employer. Packet given to  [place], CMA to contact patient to obtain signatures and fax to employer.  Godfrey PickKimberly S. Tiburcio PeaHarris, MSN, FNP-C [facility]  [address] [place], [place] [address] [phone]

## 2016-09-27 ENCOUNTER — Other Ambulatory Visit: Payer: Self-pay | Admitting: Family Medicine

## 2016-09-29 ENCOUNTER — Telehealth: Payer: Self-pay | Admitting: Family Medicine

## 2016-09-29 ENCOUNTER — Ambulatory Visit (INDEPENDENT_AMBULATORY_CARE_PROVIDER_SITE_OTHER): Payer: BLUE CROSS/BLUE SHIELD | Admitting: Family Medicine

## 2016-09-29 VITALS — BP 120/84 | HR 99 | Temp 98.9°F | Ht 59.0 in | Wt 140.0 lb

## 2016-09-29 DIAGNOSIS — R42 Dizziness and giddiness: Secondary | ICD-10-CM | POA: Diagnosis not present

## 2016-09-29 DIAGNOSIS — F4323 Adjustment disorder with mixed anxiety and depressed mood: Secondary | ICD-10-CM | POA: Diagnosis not present

## 2016-09-29 DIAGNOSIS — R739 Hyperglycemia, unspecified: Secondary | ICD-10-CM | POA: Diagnosis not present

## 2016-09-29 LAB — GLUCOSE, CAPILLARY: Glucose-Capillary: 268 mg/dL — ABNORMAL HIGH (ref 65–99)

## 2016-09-29 LAB — CBC WITH DIFFERENTIAL/PLATELET
Basophils Absolute: 0 cells/uL (ref 0–200)
Basophils Relative: 0 %
Eosinophils Absolute: 272 cells/uL (ref 15–500)
Eosinophils Relative: 4 %
HEMATOCRIT: 41.2 % (ref 35.0–45.0)
HEMOGLOBIN: 13.8 g/dL (ref 11.7–15.5)
LYMPHS ABS: 1836 {cells}/uL (ref 850–3900)
Lymphocytes Relative: 27 %
MCH: 31.2 pg (ref 27.0–33.0)
MCHC: 33.5 g/dL (ref 32.0–36.0)
MCV: 93.2 fL (ref 80.0–100.0)
MONO ABS: 272 {cells}/uL (ref 200–950)
MPV: 10 fL (ref 7.5–12.5)
Monocytes Relative: 4 %
NEUTROS ABS: 4420 {cells}/uL (ref 1500–7800)
NEUTROS PCT: 65 %
Platelets: 232 10*3/uL (ref 140–400)
RBC: 4.42 MIL/uL (ref 3.80–5.10)
RDW: 14.2 % (ref 11.0–15.0)
WBC: 6.8 10*3/uL (ref 3.8–10.8)

## 2016-09-29 LAB — BASIC METABOLIC PANEL
BUN: 9 mg/dL (ref 7–25)
CHLORIDE: 99 mmol/L (ref 98–110)
CO2: 25 mmol/L (ref 20–31)
Calcium: 9.4 mg/dL (ref 8.6–10.2)
Creat: 0.68 mg/dL (ref 0.50–1.10)
GLUCOSE: 235 mg/dL — AB (ref 65–99)
POTASSIUM: 4.7 mmol/L (ref 3.5–5.3)
Sodium: 136 mmol/L (ref 135–146)

## 2016-09-29 LAB — POCT URINALYSIS DIP (DEVICE)
Bilirubin Urine: NEGATIVE
GLUCOSE, UA: 500 mg/dL — AB
KETONES UR: NEGATIVE mg/dL
Leukocytes, UA: NEGATIVE
Nitrite: NEGATIVE
PH: 5.5 (ref 5.0–8.0)
PROTEIN: 30 mg/dL — AB
SPECIFIC GRAVITY, URINE: 1.025 (ref 1.005–1.030)
Urobilinogen, UA: 0.2 mg/dL (ref 0.0–1.0)

## 2016-09-29 MED ORDER — NON FORMULARY
10.0000 [IU] | Freq: Once | Status: AC
  Administered 2016-09-29: 10 [IU] via SUBCUTANEOUS

## 2016-09-29 MED ORDER — OMEPRAZOLE 40 MG PO CPDR: 40.0000 mg | DELAYED_RELEASE_CAPSULE | Freq: Every day | ORAL | 1 refills | Status: DC

## 2016-09-29 MED ORDER — INSULIN REGULAR HUMAN 100 UNIT/ML IJ SOLN: 10.0000 [IU] | Freq: Once | INTRAMUSCULAR | Status: DC

## 2016-09-29 MED ORDER — HYDROXYZINE HCL 25 MG PO TABS: 12.5000 mg | ORAL_TABLET | Freq: Three times a day (TID) | ORAL | 0 refills | Status: AC | PRN

## 2016-09-29 MED ORDER — BLOOD GLUCOSE METER KIT: PACK | 0 refills | Status: AC

## 2016-09-29 MED FILL — OMEPRAZOLE DR 40 MG CAPSULE: 40 | 30 days supply | Qty: 30 | Fill #0

## 2016-09-29 MED FILL — hydrOXYzine HCL 25 MG TABS: 25 | 5 days supply | Qty: 15 | Fill #0

## 2016-09-29 NOTE — Telephone Encounter (Signed)
Please schedule patient for vestibular rehab x 4 visits. I have also placed a referral for neurology, please see if we can have patient scheduled within the next 1-2 weeks.

## 2016-09-29 NOTE — Patient Instructions (Signed)
-For heartburn and nausea, these symptoms are likely related to acid reflux. Start omeprazole 40 mg once daily to manage symptoms.   -For dizziness, I am referring you to neurology and vestibular rehab. They will contact you to schedule an appointment.  -Facial swelling is likely related to excessive crying over the last several days. For acute situational anxiety and facial swelling, you may take Hydroxyzine 12.5-25 mg up to 3 times daily as needed for anxiety.      Benign Positional Vertigo Vertigo is the feeling that you or your surroundings are moving when they are not. Benign positional vertigo is the most common form of vertigo. The cause of this condition is not serious (is benign). This condition is triggered by certain movements and positions (is positional). This condition can be dangerous if it occurs while you are doing something that could endanger you or others, such as driving. What are the causes? In many cases, the cause of this condition is not known. It may be caused by a disturbance in an area of the inner ear that helps your brain to sense movement and balance. This disturbance can be caused by a viral infection (labyrinthitis), head injury, or repetitive motion. What increases the risk? This condition is more likely to develop in:  Women.  People who are 53 years of age or older.  What are the signs or symptoms? Symptoms of this condition usually happen when you move your head or your eyes in different directions. Symptoms may start suddenly, and they usually last for less than a minute. Symptoms may include:  Loss of balance and falling.  Feeling like you are spinning or moving.  Feeling like your surroundings are spinning or moving.  Nausea and vomiting.  Blurred vision.  Dizziness.  Involuntary eye movement (nystagmus).  Symptoms can be mild and cause only slight annoyance, or they can be severe and interfere with daily life. Episodes of benign  positional vertigo may return (recur) over time, and they may be triggered by certain movements. Symptoms may improve over time. How is this diagnosed? This condition is usually diagnosed by medical history and a physical exam of the head, neck, and ears. You may be referred to a health care provider who specializes in ear, nose, and throat (ENT) problems (otolaryngologist) or a provider who specializes in disorders of the nervous system (neurologist). You may have additional testing, including:  MRI.  A CT scan.  Eye movement tests. Your health care provider may ask you to change positions quickly while he or she watches you for symptoms of benign positional vertigo, such as nystagmus. Eye movement may be tested with an electronystagmogram (ENG), caloric stimulation, the Dix-Hallpike test, or the roll test.  An electroencephalogram (EEG). This records electrical activity in your brain.  Hearing tests.  How is this treated? Usually, your health care provider will treat this by moving your head in specific positions to adjust your inner ear back to normal. Surgery may be needed in severe cases, but this is rare. In some cases, benign positional vertigo may resolve on its own in 2-4 weeks. Follow these instructions at home: Safety  Move slowly.Avoid sudden body or head movements.  Avoid driving.  Avoid operating heavy machinery.  Avoid doing any tasks that would be dangerous to you or others if a vertigo episode would occur.  If you have trouble walking or keeping your balance, try using a cane for stability. If you feel dizzy or unstable, sit down right away.  Return to your normal activities as told by your health care provider. Ask your health care provider what activities are safe for you. General instructions  Take over-the-counter and prescription medicines only as told by your health care provider.  Avoid certain positions or movements as told by your health care  provider.  Drink enough fluid to keep your urine clear or pale yellow.  Keep all follow-up visits as told by your health care provider. This is important. Contact a health care provider if:  You have a fever.  Your condition gets worse or you develop new symptoms.  Your family or friends notice any behavioral changes.  Your nausea or vomiting gets worse.  You have numbness or a "pins and needles" sensation. Get help right away if:  You have difficulty speaking or moving.  You are always dizzy.  You faint.  You develop severe headaches.  You have weakness in your legs or arms.  You have changes in your hearing or vision.  You develop a stiff neck.  You develop sensitivity to light. This information is not intended to replace advice given to you by your health care provider. Make sure you discuss any questions you have with your health care provider. Document Released: 01/23/2006 Document Revised: 09/23/2015 Document Reviewed: 08/10/2014 Elsevier Interactive Patient Education  [employer]2018 Elsevier Inc.

## 2016-09-29 NOTE — Progress Notes (Signed)
Patient ID: Olivia Reeves, female    DOB: 12-22-1970, 46 y.o.   MRN: [id number]  PCP: Bing Neighbors, FNP  Chief Complaint  Patient presents with  . Dizziness  . Facial Swelling    Subjective:  HPI Olivia Reeves is a 46 y.o. female presents for evaluation of dizziness and facial swelling.  Medication Problems include: Persistent dizziness,  Uncontrolled T2DM, and  Hypothyroidism   Olivia Reeves reports ongoing dizziness in spite of previous conversation on 09/12/16 in which she advised symptoms had resolved. Reports worsening dizziness with bending, lifting, walking, positional rotational movements, all precipitate episodes of dizziness. Medication previously prescribe is not improving dizziness, it is making her ill. She reports compliance with Levothyroxine for hypothyroidism treatment. Reports associated symptoms of nausea without vomiting. Had a recent eye exam, with normal findings. Recent CT of head, found no acute abnormalities.  Reports concern regarding coverage of FMLA as she did not return to work after the time frame in which she was written out of work.  Spoke with FMLA coordinator 09/27/16 and advised of dates patient was under my care for dizziness and that symptoms resolved on 09/12/16. Olivia Reeves and husband are concern that patient employment is in jeopardy due to absences has been ongoing since 09/12/16.  Reiterated that only dates the patient was under my care will be reported for FMLA purposes.  Facial Swelling, Anxiety,  and Stress  Crying and lot daily as daughter ran away. She is aware daughter 72 years old. When to bed Yesterday after two days for persistent crying. No ocular involvement.   Acid Reflux Eating and burping nausea tightness in xiphoid    Social History   Social History  . Marital status: Married    Spouse name: N/A  . Number of children: N/A  . Years of education: N/A   Occupational History  . Not on file.   Social History Main Topics   . Smoking status: Never Smoker  . Smokeless tobacco: Never Used  . Alcohol use Yes     Comment: 1 glass of wine/week  . Drug use: No  . Sexual activity: Yes    Birth control/ protection: IUD   Other Topics Concern  . Not on file   Social History Narrative  . No narrative on file  Review of Systems See HPI   Patient Active Problem List   Diagnosis Date Noted  . T2DM (type 2 diabetes mellitus) (HCC) 07/27/2016  . Hypothyroidism 07/27/2016  . Right Hydrosalpinx 01/12/2016    Allergies  Allergen Reactions  . Aspirin Hives    Prior to Admission medications   Medication Sig Start Date End Date Taking? Authorizing Provider  glipiZIDE (GLUCOTROL) 5 MG tablet Take 1 tablet (5 mg total) by mouth 2 (two) times daily before a meal. 07/24/16  Yes Bing Neighbors, FNP  levothyroxine (SYNTHROID, LEVOTHROID) 75 MCG tablet Take 1 tablet (75 mcg total) by mouth daily. 09/06/16  Yes Bing Neighbors, FNP  meclizine (ANTIVERT) 25 MG tablet Take 1 tablet (25 mg total) by mouth 3 (three) times daily as needed. 09/06/16  Yes Bing Neighbors, FNP  metFORMIN (GLUCOPHAGE) 1000 MG tablet Take 1 tablet (1,000 mg total) by mouth 2 (two) times daily with a meal. 07/24/16  Yes Bing Neighbors, FNP    Past Medical, Surgical Family and Social History reviewed and updated.    Objective:   Today's Vitals   09/29/16 0953  BP: 120/84  Pulse: 99  Temp: 98.9 F (37.2 C)  TempSrc: Oral  SpO2: 99%  Weight: 140 lb (63.5 kg)  Height: 4\' 11"  (1.499 m)    Wt Readings from Last 3 Encounters:  09/29/16 140 lb (63.5 kg)  09/06/16 136 lb (61.7 kg)  07/24/16 137 lb (62.1 kg)    Physical Exam  Constitutional: She is oriented to person, place, and time. She appears well-developed and well-nourished.  HENT:  Head: Normocephalic.  negative of unilateral swelling   Neck: Normal range of motion. Neck supple. Thyromegaly present.  Cardiovascular: Normal rate, regular rhythm, normal heart sounds and  intact distal pulses.   Pulmonary/Chest: Effort normal and breath sounds normal.  Abdominal: Soft. Bowel sounds are normal. She exhibits no distension. There is no tenderness. There is no rebound and no guarding.  Neurological: She is alert and oriented to person, place, and time. Coordination normal.  Dizziness non-reproducible  Negative Dill Hallpike  maneuver   Skin: Skin is warm and dry.  Psychiatric: Judgment and thought content normal. Her mood appears anxious. Her affect is labile. Her speech is rapid and/or pressured. She is agitated and hyperactive. Cognition and memory are normal.  Emotions very liable today.  Laughing, crying, and emotionless. At times extremely anxious.     Assessment & Plan:  1. Dizziness - Ambulatory referral to Neurology - CBC with Differential - Ambulatory referral to Physical Therapy  2. Hyperglycemia, blood sugar 268 - Basic metabolic panel - NON FORMULARY 10 Units; Inject 10 Units into the skin once.  3. Situational mixed Anxiety and Depressive Disorder  -For acute situational anxiety and facial swelling, you may take Hydroxyzine 12.5-25 mg up to 3 times daily as needed for anxiety. -Facial swelling was likely related to excessive crying over the last several days, as it appears on exam symmetrical with left side of face.   4. GERD -Omeprazole 40 mg daily, continually.  RTC: 3 weeks for anxiety and depression follow-up.  Godfrey PickKimberly S. Tiburcio PeaHarris, MSN, FNP-C [facility]  [address] [place], [place] [address] [phone]

## 2016-10-02 NOTE — Telephone Encounter (Signed)
Called to check on status of appointment and [facility] have patient referral and will work on getting patient scheduled this week. [phone]

## 2016-10-03 MED FILL — glipiZIDE 5 MG TABS: 5 | 30 days supply | Qty: 60 | Fill #2

## 2016-10-03 MED FILL — metFORMIN HCL 1000 MG TABS: 1000 | 30 days supply | Qty: 60 | Fill #2

## 2016-10-10 ENCOUNTER — Ambulatory Visit: Payer: BLUE CROSS/BLUE SHIELD | Admitting: Physical Therapy

## 2016-10-11 NOTE — Telephone Encounter (Signed)
Patient had a appointment on 10/10/2016 but she canceled appointment

## 2016-10-20 MED FILL — TRAVEL SICKNESS 25 MG TAB C: 25 | 20 days supply | Qty: 60 | Fill #0

## 2016-10-24 ENCOUNTER — Other Ambulatory Visit (HOSPITAL_COMMUNITY)
Admission: RE | Admit: 2016-10-24 | Discharge: 2016-10-24 | Disposition: A | Discharge status: Home / Self Care | Payer: BLUE CROSS/BLUE SHIELD | Source: Ambulatory Visit | Attending: Family Medicine | Admitting: Family Medicine

## 2016-10-24 ENCOUNTER — Ambulatory Visit (INDEPENDENT_AMBULATORY_CARE_PROVIDER_SITE_OTHER): Payer: BLUE CROSS/BLUE SHIELD | Admitting: Family Medicine

## 2016-10-24 ENCOUNTER — Encounter: Payer: Self-pay | Admitting: Family Medicine

## 2016-10-24 VITALS — BP 105/66 | HR 77 | Temp 97.7°F | Resp 12 | Ht 59.0 in | Wt 144.6 lb

## 2016-10-24 DIAGNOSIS — E039 Hypothyroidism, unspecified: Secondary | ICD-10-CM | POA: Insufficient documentation

## 2016-10-24 DIAGNOSIS — Z1231 Encounter for screening mammogram for malignant neoplasm of breast: Secondary | ICD-10-CM | POA: Diagnosis not present

## 2016-10-24 DIAGNOSIS — E119 Type 2 diabetes mellitus without complications: Secondary | ICD-10-CM | POA: Diagnosis present

## 2016-10-24 DIAGNOSIS — Z01419 Encounter for gynecological examination (general) (routine) without abnormal findings: Secondary | ICD-10-CM

## 2016-10-24 DIAGNOSIS — Z1239 Encounter for other screening for malignant neoplasm of breast: Secondary | ICD-10-CM

## 2016-10-24 LAB — POCT GLYCOSYLATED HEMOGLOBIN (HGB A1C): HEMOGLOBIN A1C: 6.6

## 2016-10-24 NOTE — Progress Notes (Signed)
Patient ID: Olivia Reeves, female    DOB: Feb 14, 1971, 46 y.o.   MRN: [id number]  PCP: Scot Jun, FNP  Chief Complaint  Patient presents with  . Gynecologic Exam  . Diabetes    3- month follow-up    Subjective:  HPI Olivia Reeves is a 46 y.o. female presents for 3 month diabetes follow-up and an annual Pap exam today. Most recently patient was seen for episodic dizziness. She reports that the dizziness had resolved but it returned again last week and she has since rescheduled her appointment with vestibular rehabilitation as she had previously canceled my initial referral appointment. During that same visit  she was placed on medication for anxiety which she reports today anxiety has completely resolved. She is no longer taking the hydroxyzine.  PAP Olivia Reeves is married and is only sexually active with husband. Denies any vaginal discharge, itching, or vaginal pain. Patient's last menstrual period was 10/11/2016 which she reports was normal for her lasted around 4-5 days. Denies any associated abdominal or pelvic pain.  Diabetes Olivia Reeves she does not monitor glucose at home. She adheres to current medication regimen. She denies any associated  urinary frequency, visual disturbances, episodic diaphoresis or hot flashes. Last A1C 9.3, 3 months prior. She has averaged her same body weight. (Body mass index is 37.74 kg/m. Reports no routine exercise and makes efforts to adheres to diabetes diet.  Social History   Social History  . Marital status: Married    Spouse name: N/A  . Number of children: N/A  . Years of education: N/A   Occupational History  . Not on file.   Social History Main Topics  . Smoking status: Never Smoker  . Smokeless tobacco: Never Used  . Alcohol use Yes     Comment: 1 glass of wine/week  . Drug use: No  . Sexual activity: Yes    Birth control/ protection: IUD   Other Topics Concern  . Not on file   Social History Narrative  .  No narrative on file   Review of Systems See HPI  Patient Active Problem List   Diagnosis Date Noted  . T2DM (type 2 diabetes mellitus) ([place]) 07/27/2016  . Hypothyroidism 07/27/2016  . Right Hydrosalpinx 01/12/2016    Allergies  Allergen Reactions  . Aspirin Hives    Prior to Admission medications   Medication Sig Start Date End Date Taking? Authorizing Provider  blood glucose meter kit and supplies Dispense based on patient and insurance preference. Check blood sugar morning and bedtime, or as needed up to 4 times daily.(FOR ICD-9 250.00, 250.01). Check blood sugar twice daily 09/29/16  Yes Scot Jun, FNP  glipiZIDE (GLUCOTROL) 5 MG tablet Take 1 tablet (5 mg total) by mouth 2 (two) times daily before a meal. 07/24/16  Yes Scot Jun, FNP  hydrOXYzine (ATARAX/VISTARIL) 25 MG tablet Take 0.5-1 tablets (12.5-25 mg total) by mouth every 8 (eight) hours as needed for itching. 09/29/16  Yes Scot Jun, FNP  levothyroxine (SYNTHROID, LEVOTHROID) 75 MCG tablet Take 1 tablet (75 mcg total) by mouth daily. 09/06/16  Yes Scot Jun, FNP  meclizine (ANTIVERT) 25 MG tablet Take 1 tablet (25 mg total) by mouth 3 (three) times daily as needed. 09/06/16  Yes Scot Jun, FNP  metFORMIN (GLUCOPHAGE) 1000 MG tablet Take 1 tablet (1,000 mg total) by mouth 2 (two) times daily with a meal. 07/24/16  Yes Scot Jun, FNP  omeprazole (PRILOSEC) 40 MG capsule  Take 1 capsule (40 mg total) by mouth daily. 09/29/16  Yes Scot Jun, FNP    Past Medical, Surgical Family and Social History reviewed and updated.    Objective:   Today's Vitals   10/24/16 0933  BP: 105/66  Pulse: 77  Resp: 12  Temp: 97.7 F (36.5 C)  TempSrc: Oral  SpO2: 95%  Weight: 144 lb 9.6 oz (65.6 kg)  Height: _0  (1.499 m)    Wt Readings from Last 3 Encounters:  10/24/16 144 lb 9.6 oz (65.6 kg)  09/29/16 140 lb (63.5 kg)  09/06/16 136 lb (61.7 kg)   Physical Exam   Constitutional: She is oriented to person, place, and time. She appears well-developed and well-nourished.  HENT:  Head: Normocephalic and atraumatic.  Eyes: Conjunctivae are normal. Pupils are equal, round, and reactive to light.  Neck: Normal range of motion.  Cardiovascular: Normal rate, regular rhythm, normal heart sounds and intact distal pulses.   Pulmonary/Chest: Effort normal and breath sounds normal.  Genitourinary: Vagina normal.  Genitourinary Comments: Breasts are symmetric without cutaneous changes, nipple inversion or discharge. No masses or tenderness, and no axillary lymphadenopathy. Normal female external genitalia without lesion. No inguinal lymphadenopathy. Vaginal mucosa is pink and moist without lesions. Cervix is closed without discharge, not friable. Pap smear obtained. No cervical motion tenderness, adnexal fullness or tenderness.  Musculoskeletal: Normal range of motion.  Neurological: She is alert and oriented to person, place, and time.  Skin: Skin is warm and dry.  Psychiatric: She has a normal mood and affect. Her behavior is normal. Judgment and thought content normal.   Assessment & Plan:  1. Type 2 diabetes mellitus without complication, without long-term current use of insulin (HCC) - POCT glycosylated hemoglobin (Hb A1C), A1c today is 6.6 -Continue diabetes medication as prescribed.  2. Encounter for gynecological examination - Cytology - P[facility] [place]  3. Screening breast examination - MM Digital Screening   You will be notified of any abnormal lab results.    [name]. [name], MSN, FNP-C [facility]  [address] Barbara Cower [place], [place] [address] [phone]

## 2016-10-24 NOTE — Patient Instructions (Addendum)
Your A1c has improved from 9.4-6.6 today. We will continue which her current regimen no changes indicated today.  You will be notified of any abnormal lab results. Keep follow-up with vestibular rehabilitation.    Diabetes Mellitus and Food It is important for you to manage your blood sugar (glucose) level. Your blood glucose level can be greatly affected by what you eat. Eating healthier foods in the appropriate amounts throughout the day at about the same time each day will help you control your blood glucose level. It can also help slow or prevent worsening of your diabetes mellitus. Healthy eating may even help you improve the level of your blood pressure and reach or maintain a healthy weight. General recommendations for healthful eating and cooking habits include:  Eating meals and snacks regularly. Avoid going long periods of time without eating to lose weight.  Eating a diet that consists mainly of plant-based foods, such as fruits, vegetables, nuts, legumes, and whole grains.  Using low-heat cooking methods, such as baking, instead of high-heat cooking methods, such as deep frying.  Work with your dietitian to make sure you understand how to use the Nutrition Facts information on food labels. How can food affect me? Carbohydrates Carbohydrates affect your blood glucose level more than any other type of food. Your dietitian will help you determine how many carbohydrates to eat at each meal and teach you how to count carbohydrates. Counting carbohydrates is important to keep your blood glucose at a healthy level, especially if you are using insulin or taking certain medicines for diabetes mellitus. Alcohol Alcohol can cause sudden decreases in blood glucose (hypoglycemia), especially if you use insulin or take certain medicines for diabetes mellitus. Hypoglycemia can be a life-threatening condition. Symptoms of hypoglycemia (sleepiness, dizziness, and disorientation) are similar to  symptoms of having too much alcohol. If your health care provider has given you approval to drink alcohol, do so in moderation and use the following guidelines:  Women should not have more than one drink per day, and men should not have more than two drinks per day. One drink is equal to: ? 12 oz of beer. ? 5 oz of wine. ? 1 oz of hard liquor.  Do not drink on an empty stomach.  Keep yourself hydrated. Have water, diet soda, or unsweetened iced tea.  Regular soda, juice, and other mixers might contain a lot of carbohydrates and should be counted.  What foods are not recommended? As you make food choices, it is important to remember that all foods are not the same. Some foods have fewer nutrients per serving than other foods, even though they might have the same number of calories or carbohydrates. It is difficult to get your body what it needs when you eat foods with fewer nutrients. Examples of foods that you should avoid that are high in calories and carbohydrates but low in nutrients include:  Trans fats (most processed foods list trans fats on the Nutrition Facts label).  Regular soda.  Juice.  Candy.  Sweets, such as cake, pie, doughnuts, and cookies.  Fried foods.  What foods can I eat? Eat nutrient-rich foods, which will nourish your body and keep you healthy. The food you should eat also will depend on several factors, including:  The calories you need.  The medicines you take.  Your weight.  Your blood glucose level.  Your blood pressure level.  Your cholesterol level.  You should eat a variety of foods, including:  Protein. ? Lean cuts  of meat. ? Proteins low in saturated fats, such as fish, egg whites, and beans. Avoid processed meats.  Fruits and vegetables. ? Fruits and vegetables that may help control blood glucose levels, such as apples, mangoes, and yams.  Dairy products. ? Choose fat-free or low-fat dairy products, such as milk, yogurt, and  cheese.  Grains, bread, pasta, and rice. ? Choose whole grain products, such as multigrain bread, whole oats, and brown rice. These foods may help control blood pressure.  Fats. ? Foods containing healthful fats, such as nuts, avocado, olive oil, canola oil, and fish.  Does everyone with diabetes mellitus have the same meal plan? Because every person with diabetes mellitus is different, there is not one meal plan that works for everyone. It is very important that you meet with a dietitian who will help you create a meal plan that is just right for you. This information is not intended to replace advice given to you by your health care provider. Make sure you discuss any questions you have with your health care provider. Document Released: 01/12/2005 Document Revised: 09/23/2015 Document Reviewed: 03/14/2013 Elsevier Interactive Patient Education  2017 [employer].

## 2016-10-26 LAB — CYTOLOGY - PAP
BACTERIAL VAGINITIS: POSITIVE — AB
CHLAMYDIA, DNA PROBE: NEGATIVE
Candida vaginitis: NEGATIVE
DIAGNOSIS: NEGATIVE
Neisseria Gonorrhea: NEGATIVE
Trichomonas: NEGATIVE

## 2016-10-26 MED ORDER — METRONIDAZOLE 0.75 % VA GEL: 1.0000 | Freq: Two times a day (BID) | VAGINAL | 0 refills | Status: AC

## 2016-10-26 NOTE — Addendum Note (Signed)
Addended by: Bing NeighborsHARRIS, Primitivo Merkey S on: 10/26/2016 10:08 PM   Modules accepted: Orders

## 2016-10-27 MED FILL — VANDAZOLE VAGINAL 0.75% GEL: 0.75 | 5 days supply | Qty: 70 | Fill #0

## 2016-11-02 ENCOUNTER — Other Ambulatory Visit: Payer: Self-pay | Admitting: Family Medicine

## 2016-11-02 DIAGNOSIS — E119 Type 2 diabetes mellitus without complications: Secondary | ICD-10-CM

## 2016-11-02 MED FILL — OMEPRAZOLE DR 40 MG CAPSULE: 40 | 30 days supply | Qty: 30 | Fill #1

## 2016-11-02 MED FILL — ?METFORMIN HCL 1,000 MG TAB: 1000 | 30 days supply | Qty: 60 | Fill #3

## 2016-11-06 MED FILL — glipiZIDE 5 MG TABS: 5 | 30 days supply | Qty: 60 | Fill #0

## 2016-11-15 ENCOUNTER — Telehealth: Payer: Self-pay

## 2016-11-15 ENCOUNTER — Ambulatory Visit: Payer: BLUE CROSS/BLUE SHIELD | Admitting: Neurology

## 2016-11-15 NOTE — Telephone Encounter (Signed)
Pt did not show for their appt with Dr. Athar today.  

## 2016-11-20 ENCOUNTER — Encounter: Payer: Self-pay | Admitting: Neurology

## 2016-12-06 MED FILL — LEVOTHYROXINE 75 MCG TABLET: 75 | 90 days supply | Qty: 90 | Fill #0

## 2017-01-25 ENCOUNTER — Ambulatory Visit: Payer: BLUE CROSS/BLUE SHIELD | Admitting: Family Medicine

## 2017-06-17 IMAGING — CT CT HEAD W/O CM
3 series · 16 of 47 positions shown, 19 images · non-contrast
Comparison: None.

CLINICAL DATA: 45-year-old diabetic female with worsening dizziness
over the past week. Initial encounter.

EXAM:
CT HEAD WITHOUT CONTRAST
TECHNIQUE: Contiguous axial images were obtained from the base of the skull
through the vertex without intravenous contrast.

[Series 2: head wo · axial · 0.41mm/px · z∈[-125,-0]mm · 10 of 30 slices shown, 13 images]
[im 3/30  brain]
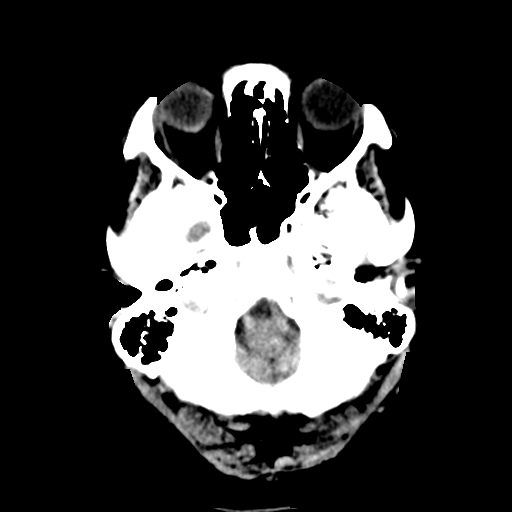
[im 3/30  bone]
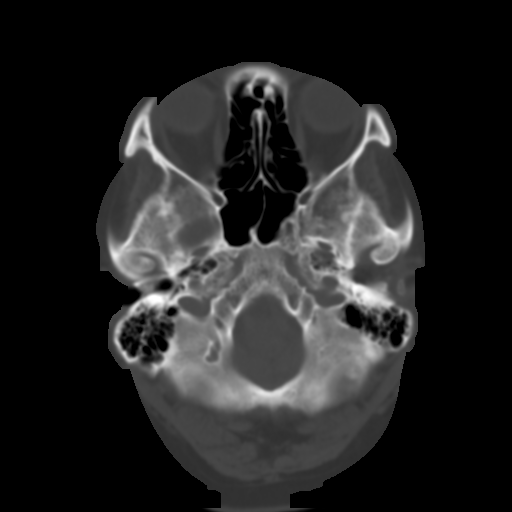
[im 6/30  brain]
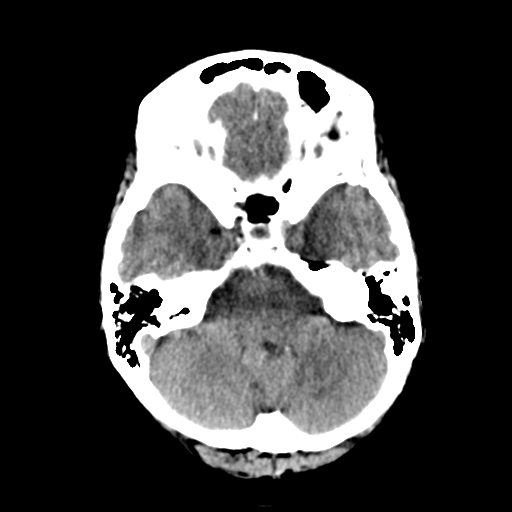
[im 9/30  brain]
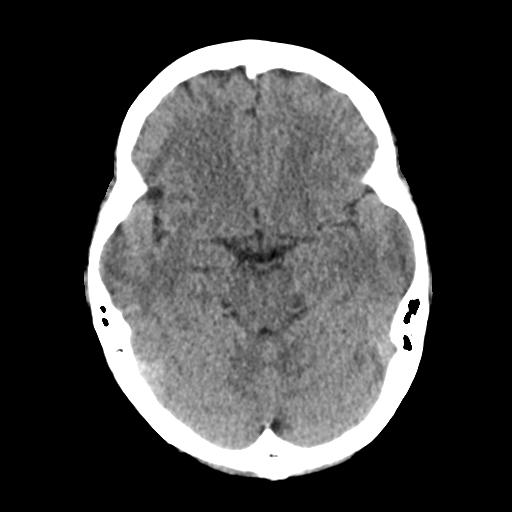
[im 11/30  brain]
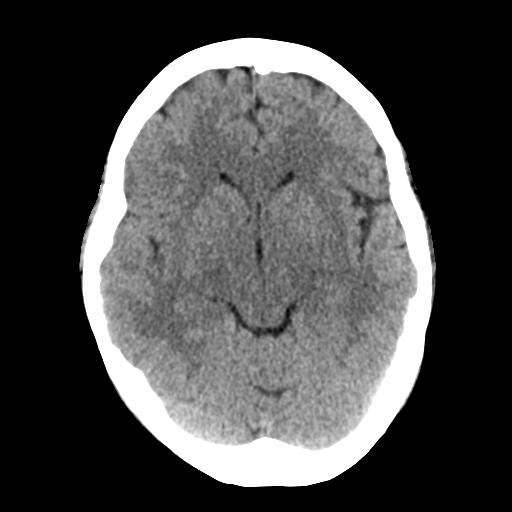
[im 14/30  brain]
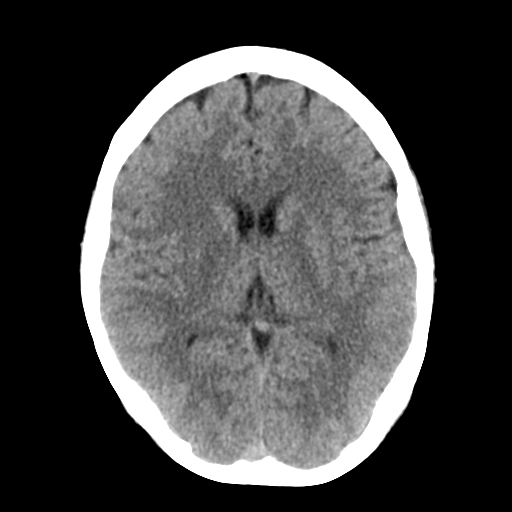
[im 14/30  bone]
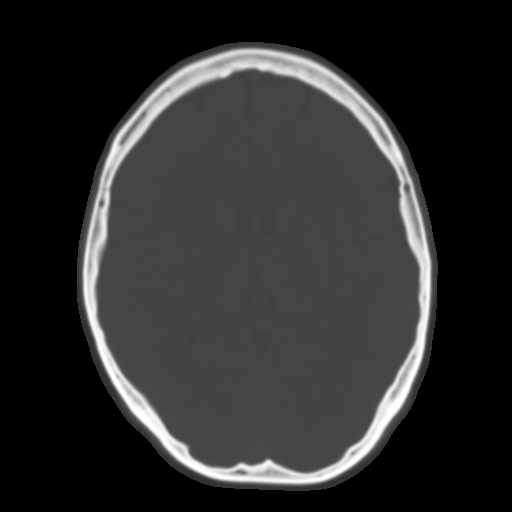
[im 17/30  brain]
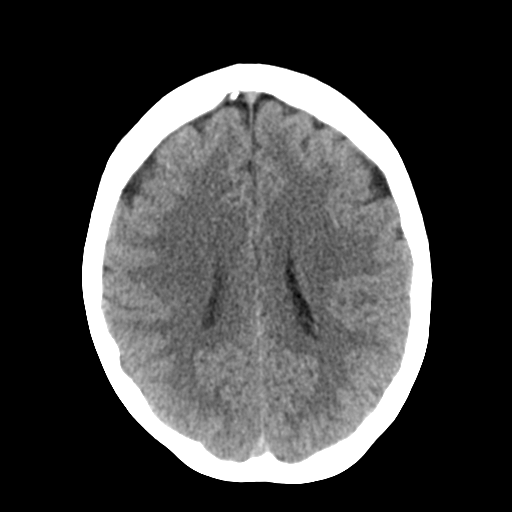
[im 20/30  brain]
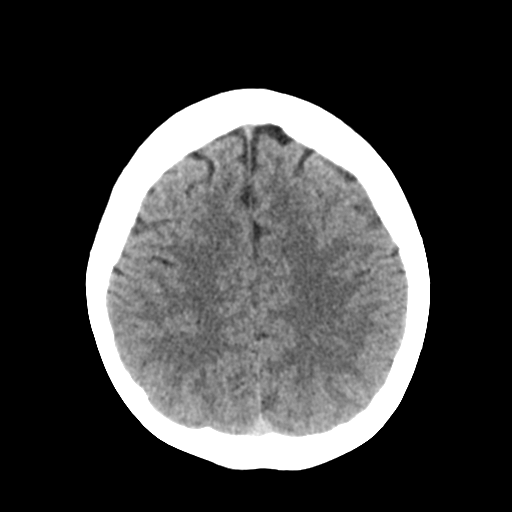
[im 23/30  brain]
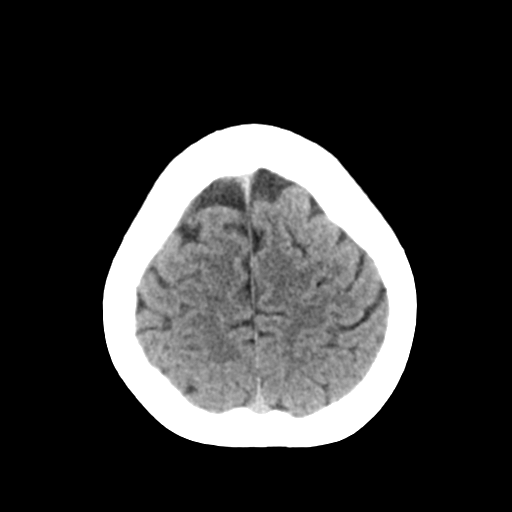
[im 25/30  brain]
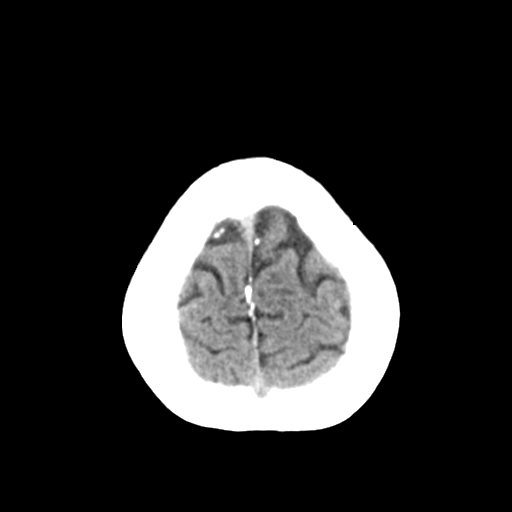
[im 25/30  bone]
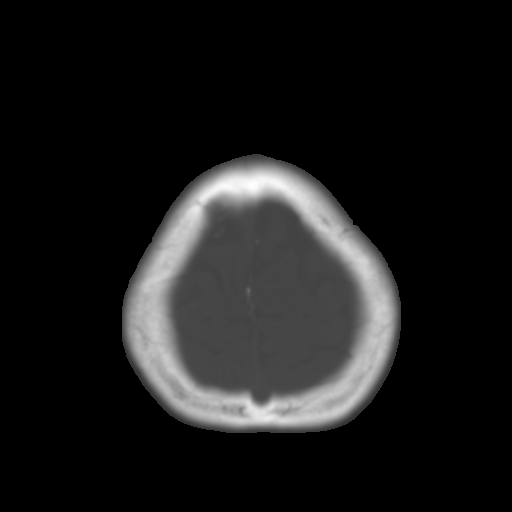
[im 28/30  brain]
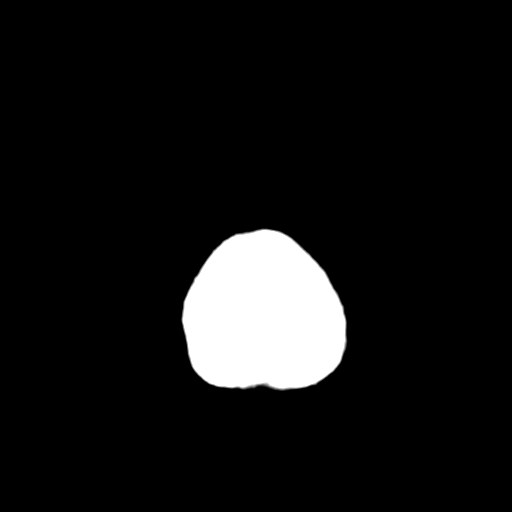

[Series 4: coronal soft tissue · coronal · 0.29mm/px · 3 of 61 slices shown]
[im 21/61  brain]
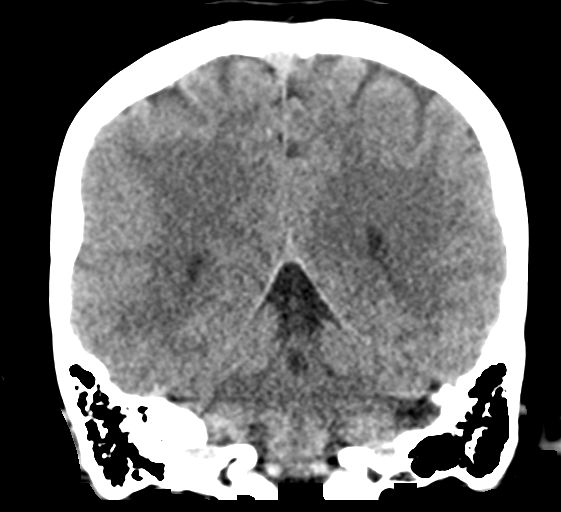
[im 27/61  brain]
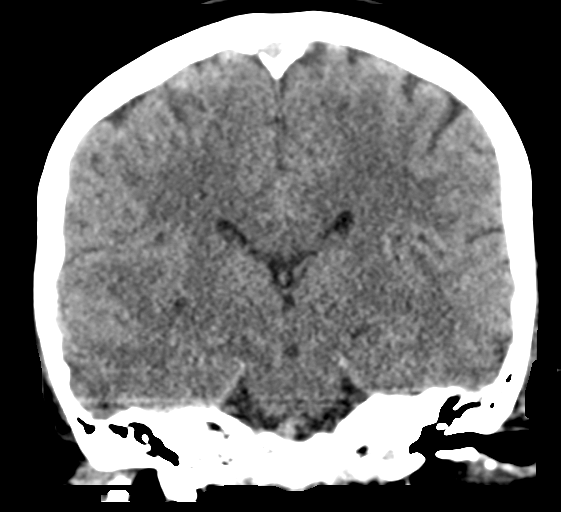
[im 34/61  brain]
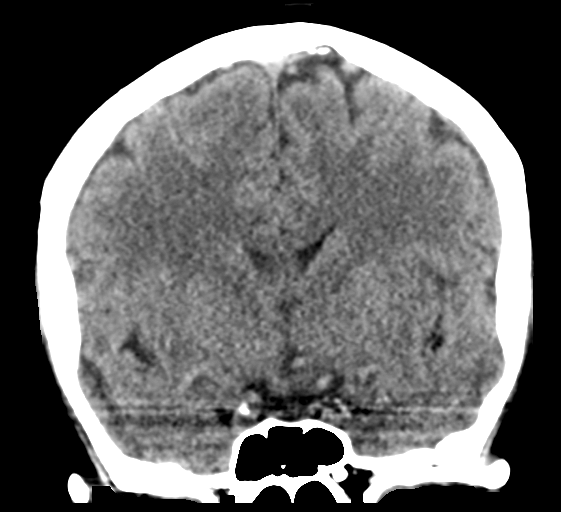

[Series 5: sagittal soft tissue · sagittal · 0.29mm/px · 3 of 49 slices shown]
[im 17/49  brain]
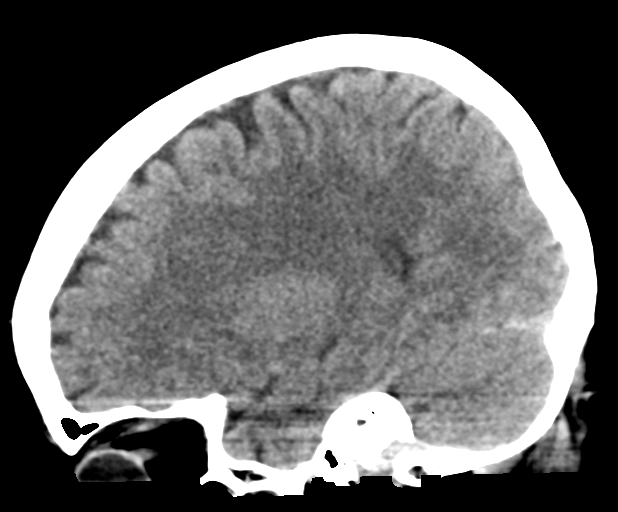
[im 25/49  brain]
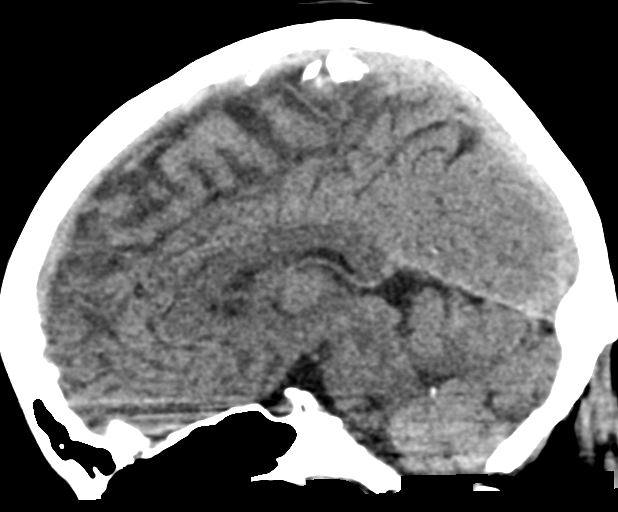
[im 33/49  brain]
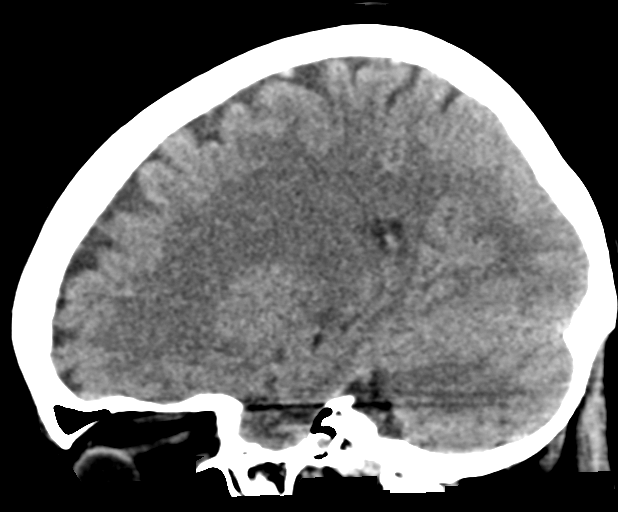

[16 of 47 positions shown; findings below may reference images not displayed]

FINDINGS: Brain: No intracranial hemorrhage or CT evidence of large acute
infarct.

No intracranial mass lesion noted on this unenhanced exam.

Vascular: No acute abnormality.

Skull: Negative.

Sinuses/Orbits: No acute orbital abnormality. Visualized paranasal
sinuses are clear

Other: Visualized mastoid air cells and middle ear cavities are
clear
IMPRESSION: No acute intracranial abnormality noted. If posterior fossa infarct
were of high clinical concern, MR imaging may be considered.

## 2018-08-28 NOTE — Telephone Encounter (Signed)
Message sent to provider
